# Patient Record
Sex: Male | Born: 1990 | Race: White | Hispanic: No | Marital: Single
Health system: Southern US, Community
[De-identification: ages and names within clinical notes are randomized; demographics above are authoritative.]

## PROBLEM LIST (undated history)

## (undated) DIAGNOSIS — J039 Acute tonsillitis, unspecified: Secondary | ICD-10-CM

## (undated) MED ORDER — HYDROCODONE-ACETAMINOPHEN 5 MG-325 MG TAB
5-325 mg | ORAL_TABLET | ORAL | Status: DC | PRN
Start: ? — End: 2012-05-06

## (undated) MED ORDER — PROMETHAZINE 25 MG TAB
25 mg | ORAL_TABLET | Freq: Four times a day (QID) | ORAL | Status: DC | PRN
Start: ? — End: 2012-07-05

## (undated) MED ORDER — AZITHROMYCIN 250 MG TAB
250 mg | PACK | ORAL | Status: DC
Start: ? — End: 2017-01-27

## (undated) MED ORDER — NAPROXEN 500 MG TAB
500 mg | ORAL_TABLET | Freq: Two times a day (BID) | ORAL | Status: AC
Start: ? — End: 2011-11-16

---
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
## 2010-09-01 NOTE — ED Notes (Signed)
Patient woke up with sore throat today and has developed nasal congestion throughout the day today.  Dizzy spells on and off today -- just feeling like head is full.

## 2010-09-01 NOTE — ED Provider Notes (Signed)
I have personally seen and evaluated patient. I find the patient's history and physical exam are consistent with the PA's NP documentation. I agree with the care provided, treatments rendered, disposition and follow up plan.

## 2010-09-01 NOTE — ED Provider Notes (Signed)
HPI Comments: Patient reports he woke up with a sore throat and has developed nasal congestion, dizziness and head congestion through out the day. Patient denies fever or chills, denies recent illness.    Patient is a 20 y.o. male presenting with sore throat, nasal congestion, and dizziness. The history is provided by the patient.   Sore Throat   This is a new problem. The current episode started 12 to 24 hours ago. The problem has not changed since onset.There has been no fever. Associated symptoms include headaches. Pertinent negatives include no diarrhea, no vomiting, no congestion, no drooling, no ear discharge, no ear pain, no plugged ear sensation, no shortness of breath, no stridor, no swollen glands, no trouble swallowing, no stiff neck and no cough. He has had no exposure to strep or mono. He has tried nothing for the symptoms.   Nasal Congestion  This is a new problem. The current episode started 12 to 24 hours ago. The problem occurs constantly. The problem has not changed since onset.Associated symptoms include headaches. Pertinent negatives include no chest pain, no abdominal pain and no shortness of breath. The symptoms are aggravated by nothing. The symptoms are relieved by nothing. He has tried nothing for the symptoms.   Dizziness   This is a new problem. The current episode started 6 to 12 hours ago. The problem has not changed since onset.There was no focality noted. Pertinent negatives include no focal weakness, no loss of sensation, no loss of balance, no slurred speech, no speech difficulty, no memory loss, no movement disorder, no agitation, no visual change, no auditory change, no mental status change, no unresponsiveness and no disorientation. There has been no fever. Associated symptoms include headaches. Pertinent negatives include no shortness of breath, no chest pain, no vomiting, no altered mental status, no confusion, no choking, no nausea, no bowel incontinence and no bladder incontinence. There were no medications administered prior to arrival.        No past medical history on file.     No past surgical history on file.      No family history on file.     History     Social History   ??? Marital Status: Single     Spouse Name: N/A     Number of Children: N/A   ??? Years of Education: N/A     Occupational History   ??? Not on file.     Social History Main Topics   ??? Smoking status: Not on file   ??? Smokeless tobacco: Not on file   ??? Alcohol Use: Not on file   ??? Drug Use: Not on file   ??? Sexually Active: Not on file     Other Topics Concern   ??? Not on file     Social History Narrative   ??? No narrative on file                  ALLERGIES: Review of patient's allergies indicates no known allergies.      Review of Systems   Constitutional: Negative.  Negative for fever, chills and fatigue.   HENT: Positive for sore throat. Negative for ear pain, congestion, drooling, trouble swallowing, neck pain and ear discharge.    Eyes: Negative.    Respiratory: Negative.  Negative for cough, choking, shortness of breath and stridor.    Cardiovascular: Negative.  Negative for chest pain.   Gastrointestinal: Negative.  Negative for nausea, vomiting, abdominal pain, diarrhea and bowel incontinence.  Genitourinary: Negative.  Negative for bladder incontinence.   Musculoskeletal: Negative.    Skin: Negative.    Neurological: Positive for dizziness and headaches. Negative for focal weakness, speech difficulty and loss of balance.   Hematological: Negative.    Psychiatric/Behavioral: Negative.  Negative for memory loss, confusion, agitation and altered mental status.   All other systems reviewed and are negative.        Filed Vitals:    09/01/10 2212   BP: 151/72   Pulse: 112   Temp: 99 ??F (37.2 ??C)   Resp: 20   Height: 6\' 3"  (1.905 m)   Weight: 136.533 kg (301 lb)   SpO2: 99%            Physical Exam   Nursing note and vitals reviewed.  Constitutional: He is oriented to person, place, and time. He appears well-developed and well-nourished.   HENT:   Head: Normocephalic and atraumatic.   Right Ear: Hearing, tympanic membrane, external ear and ear canal normal.   Left Ear: Hearing, tympanic membrane, external ear and ear canal normal.   Nose: Rhinorrhea present. Right sinus exhibits no maxillary sinus tenderness and no frontal sinus tenderness. Left sinus exhibits no maxillary sinus tenderness and no frontal sinus tenderness.   Mouth/Throat: Posterior oropharyngeal edema and posterior oropharyngeal erythema present. No oropharyngeal exudate or tonsillar abscesses.   Eyes: Conjunctivae and EOM are normal. Pupils are equal, round, and reactive to light. Right eye exhibits no discharge. Left eye exhibits no discharge. No scleral icterus.   Neck: Normal range of motion. Neck supple. No tracheal deviation present. No thyromegaly present.   Cardiovascular: Normal rate, regular rhythm, normal heart sounds and intact distal pulses.    No murmur heard.  Pulmonary/Chest: Effort normal and breath sounds normal. No respiratory distress. He has no wheezes. He has no rales.   Abdominal: Soft. He exhibits no distension. There is no tenderness. There is no rebound and no guarding.    Musculoskeletal: Normal range of motion. He exhibits no edema and no tenderness.   Lymphadenopathy:     He has no cervical adenopathy.   Neurological: He is alert and oriented to person, place, and time. No cranial nerve deficit. Coordination normal.   Skin: Skin is warm. No rash noted. No erythema.   Psychiatric: He has a normal mood and affect. His behavior is normal. Judgment and thought content normal.        MDM    Procedures

## 2010-09-02 LAB — POC GROUP A STREP: Group A strep (POC): NEGATIVE

## 2010-09-02 MED ORDER — PSEUDOEPHEDRINE SR 120 MG TAB
120 mg | ORAL_TABLET | Freq: Two times a day (BID) | ORAL | Status: DC | PRN
Start: 2010-09-02 — End: 2011-11-06

## 2011-07-05 NOTE — ED Notes (Signed)
I have reviewed discharge instructions with the patient.  The patient verbalized understanding.  Home with family.

## 2011-07-05 NOTE — ED Provider Notes (Signed)
Patient is a 21 y.o. male presenting with dental problem. The history is provided by the patient.   Dental Pain   This is a new problem. The current episode started more than 2 days ago. The problem occurs constantly. The problem has not changed since onset.The pain is located in the right upper mouth.The quality of the pain is aching.  The pain is at a severity of 9/10. The pain is severe. There was no vomiting, no nausea, no fever, no swelling, no chest pain, no shortness of breath, no headaches, no gum redness and no drainage. He has tried acetaminophen for the symptoms. The patient has no cardiac history.       History reviewed. No pertinent past medical history.     History reviewed. No pertinent past surgical history.      History reviewed. No pertinent family history.     History     Social History   ??? Marital Status: SINGLE     Spouse Name: N/A     Number of Children: N/A   ??? Years of Education: N/A     Occupational History   ??? Not on file.     Social History Main Topics   ??? Smoking status: Current Everyday Smoker   ??? Smokeless tobacco: Not on file   ??? Alcohol Use: No   ??? Drug Use:    ??? Sexually Active:      Other Topics Concern   ??? Not on file     Social History Narrative   ??? No narrative on file                  ALLERGIES: Review of patient's allergies indicates no known allergies.      Review of Systems   Constitutional: Negative.    HENT: Positive for dental problem.    Eyes: Negative.    Respiratory: Negative.    Cardiovascular: Negative.    Gastrointestinal: Negative.    Genitourinary: Negative.    Musculoskeletal: Negative.    Skin: Negative.    Neurological: Negative.    Hematological: Negative.    Psychiatric/Behavioral: Negative.    All other systems reviewed and are negative.        Filed Vitals:    07/05/11 2018   BP: 157/82   Pulse: 95   Temp: 98.3 ??F (36.8 ??C)   Resp: 18   Height: 6\' 3"  (1.905 m)   Weight: 95.255 kg (210 lb)   SpO2: 99%            Physical Exam   Nursing note and vitals  reviewed.  Constitutional: He is oriented to person, place, and time. He appears well-developed and well-nourished.   HENT:   Head: Normocephalic and atraumatic. No trismus in the jaw.   Right Ear: External ear normal.   Left Ear: External ear normal.   Mouth/Throat: Oropharynx is clear and moist and mucous membranes are normal. He does not have dentures. No oral lesions. Normal dentition. Dental caries present. No dental abscesses, uvula swelling or lacerations. No oropharyngeal exudate.   Eyes: Conjunctivae and EOM are normal. Pupils are equal, round, and reactive to light. Right eye exhibits no discharge. Left eye exhibits no discharge. No scleral icterus.   Neck: Normal range of motion. Neck supple. No tracheal deviation present. No thyromegaly present.   Cardiovascular: Normal rate, regular rhythm, normal heart sounds and intact distal pulses.    No murmur heard.  Pulmonary/Chest: Effort normal and breath sounds normal. No respiratory  distress. He has no wheezes. He has no rales.   Abdominal: Soft. Bowel sounds are normal. He exhibits no distension. There is no tenderness. There is no rebound and no guarding.   Musculoskeletal: Normal range of motion. He exhibits no edema and no tenderness.   Lymphadenopathy:     He has no cervical adenopathy.   Neurological: He is alert and oriented to person, place, and time. No cranial nerve deficit. Coordination normal.   Skin: Skin is warm. No rash noted. No erythema.   Psychiatric: He has a normal mood and affect. His behavior is normal. Judgment and thought content normal.        MDM    Procedures

## 2011-07-05 NOTE — ED Provider Notes (Signed)
I have personally seen and evaluated patient. I find the patient's history and physical exam are consistent with the PA's NP documentation. I agree with the care provided, treatments rendered, disposition and follow up plan.

## 2011-07-05 NOTE — ED Notes (Signed)
This RN never saw or assessed this patient

## 2011-07-05 NOTE — ED Notes (Signed)
Patient reports dental pain for 1 week.  Has taken ibuprofen and used oragel without relief.

## 2011-07-06 MED ORDER — ACETAMINOPHEN-CODEINE 300 MG-30 MG TAB
300-30 mg | ORAL_TABLET | Freq: Four times a day (QID) | ORAL | Status: DC | PRN
Start: 2011-07-06 — End: 2011-11-06

## 2011-07-06 MED ORDER — NAPROXEN 375 MG TAB
375 mg | ORAL_TABLET | Freq: Two times a day (BID) | ORAL | Status: DC
Start: 2011-07-06 — End: 2011-11-06

## 2011-07-06 MED ORDER — PENICILLIN V-K 500 MG TAB
500 mg | ORAL_TABLET | Freq: Three times a day (TID) | ORAL | Status: AC
Start: 2011-07-06 — End: 2011-07-12

## 2011-11-06 NOTE — ED Provider Notes (Signed)
HPI Comments: Pt c/o right hand pain secondary to shutting a car door on it earlier today. Pt reports he is still able to move his hands and finger well but they are bruised and painful. Pt denies any numbness/tingling in his hand. Pt denies any other injury. Pt denies CP, SOB, abdominal pain, n/v/d, dysuria, hematuria or frequency with urination.    Patient is a 21 y.o. male presenting with hand pain. The history is provided by the patient.   Hand Pain   This is a new problem. The current episode started 1 to 2 hours ago. The problem occurs constantly. The problem has not changed since onset.The pain is present in the right fingers. The pain is at a severity of 8/10. The pain is moderate. Pertinent negatives include no numbness, full range of motion, no stiffness, no tingling, no itching, no back pain and no neck pain. He has tried nothing for the symptoms. There has been a history of trauma.        History reviewed. No pertinent past medical history.     History reviewed. No pertinent past surgical history.      History reviewed. No pertinent family history.     History     Social History   ??? Marital Status: SINGLE     Spouse Name: N/A     Number of Children: N/A   ??? Years of Education: N/A     Occupational History   ??? Not on file.     Social History Main Topics   ??? Smoking status: Current Every Day Smoker -- 0.50 packs/day   ??? Smokeless tobacco: Never Used   ??? Alcohol Use: No   ??? Drug Use: No   ??? Sexually Active: Not on file     Other Topics Concern   ??? Not on file     Social History Narrative   ??? No narrative on file                  ALLERGIES: Review of patient's allergies indicates no known allergies.      Review of Systems   Constitutional: Negative for fever and chills.   HENT: Negative for ear pain, sore throat and neck pain.    Eyes: Negative for pain.   Respiratory: Negative for cough and wheezing.    Cardiovascular: Negative for chest pain.   Gastrointestinal: Negative for nausea, vomiting and abdominal  pain.   Genitourinary: Negative for dysuria and frequency.   Musculoskeletal: Positive for joint swelling and arthralgias. Negative for back pain and stiffness.   Skin: Negative for color change, itching and wound.   Neurological: Negative for dizziness, tingling and numbness.   Hematological: Negative for adenopathy.   Psychiatric/Behavioral: Negative for confusion and agitation.       Filed Vitals:    11/06/11 2227   BP: 132/83   Pulse: 109   Temp: 98.4 ??F (36.9 ??C)   Resp: 20   Height: 6\' 3"  (1.905 m)   Weight: 97.523 kg (215 lb)   SpO2: 98%            Physical Exam   Nursing note and vitals reviewed.  Constitutional: He is oriented to person, place, and time. He appears well-developed and well-nourished.   HENT:   Head: Normocephalic and atraumatic.   Right Ear: External ear normal.   Left Ear: External ear normal.   Eyes: Conjunctivae and EOM are normal. Pupils are equal, round, and reactive to light. Right eye exhibits no  discharge. Left eye exhibits no discharge. No scleral icterus.   Neck: Normal range of motion.   Cardiovascular: Normal rate, regular rhythm, normal heart sounds and intact distal pulses.  Exam reveals no gallop and no friction rub.    No murmur heard.  Pulses:       Radial pulses are 2+ on the right side, and 2+ on the left side.   Pulmonary/Chest: Effort normal and breath sounds normal. No respiratory distress. He has no wheezes. He has no rales.   Musculoskeletal: He exhibits tenderness. He exhibits no edema.        Right wrist: Normal.        Left wrist: Normal.        Right hand: He exhibits tenderness, bony tenderness and swelling. He exhibits normal range of motion, normal two-point discrimination, normal capillary refill, no deformity and no laceration.        Left hand: Normal.        Hands:  Lymphadenopathy:     He has no cervical adenopathy.   Neurological: He is alert and oriented to person, place, and time. No cranial nerve deficit. Coordination normal.   Skin: Skin is warm. No  rash noted. No erythema.   Psychiatric: He has a normal mood and affect. His behavior is normal. Judgment and thought content normal.        MDM    Procedures

## 2011-11-06 NOTE — ED Notes (Signed)
Pt reports shutting his right hand in a car door today.  Used Ice immediately afterwards.  Pain from middle finger down to 5th finger.

## 2011-11-07 MED ADMIN — ketorolac (TORADOL) injection 60 mg: INTRAMUSCULAR | @ 03:00:00 | NDC 00409379501

## 2011-11-07 MED FILL — KETOROLAC TROMETHAMINE 30 MG/ML INJECTION: 30 mg/mL (1 mL) | INTRAMUSCULAR | Qty: 2

## 2011-11-07 NOTE — ED Notes (Signed)
I have reviewed discharge instructions with the patient.  The patient verbalized understanding. Patient ambulatory and stable upon discharge in no apparent distress.

## 2011-11-07 NOTE — ED Provider Notes (Signed)
I was personally available for consultation in the emergency department.  I have reviewed the chart and agree with the documentation recorded by the MLP, including the assessment, treatment plan, and disposition.  Gracelyn Coventry H Jaise Moser, MD

## 2012-01-13 NOTE — ED Provider Notes (Signed)
HPI Comments: The patient hyperextended his left little finger while "horseplaying" earlier today. He describes what sounds like a dislocation at the MP joint. His friend put the finger back in place. The patient now complains of pain primarily in the area of the MP joint area there is no other injury.    Patient is a 21 y.o. male presenting with finger pain.   Finger Pain          History reviewed. No pertinent past medical history.     History reviewed. No pertinent past surgical history.      History reviewed. No pertinent family history.     History     Social History   ??? Marital Status: MARRIED     Spouse Name: N/A     Number of Children: N/A   ??? Years of Education: N/A     Occupational History   ??? Not on file.     Social History Main Topics   ??? Smoking status: Current Every Day Smoker -- 0.50 packs/day   ??? Smokeless tobacco: Never Used   ??? Alcohol Use: No   ??? Drug Use: No   ??? Sexually Active: Not on file     Other Topics Concern   ??? Not on file     Social History Narrative   ??? No narrative on file                  ALLERGIES: Review of patient's allergies indicates no known allergies.      Review of Systems   Musculoskeletal: Positive for joint swelling.       Filed Vitals:    01/13/12 1640   BP: 133/79   Pulse: 94   Temp: 98 ??F (36.7 ??C)   Resp: 17   Height: 6\' 3"  (1.905 m)   Weight: 111.16 kg (245 lb 1 oz)   SpO2: 99%            Physical Exam   Nursing note and vitals reviewed.  Constitutional: He is oriented to person, place, and time. He appears well-developed and well-nourished. No distress.   Neck: Normal range of motion.   Cardiovascular: Normal rate.    Pulmonary/Chest: Effort normal. No respiratory distress.   Musculoskeletal:   There is mild swelling/tenderness of the left little finger MP joint. The PIP and DIP joints are normal with normal range of motion. The remainder of the hand is nontender/nonswollen. The little finger has normal distal sensation and capillary refill.   Neurological: He is  alert and oriented to person, place, and time.   Skin: Skin is warm and dry.   Psychiatric: He has a normal mood and affect. His behavior is normal.        MDM    Procedures

## 2012-01-13 NOTE — ED Notes (Signed)
Patient discharged home after receiving discharge instructions from MD/PA.  Patient voiced understanding and doesn't have any questions at this time. Patient in no distress at this time.

## 2012-01-13 NOTE — ED Notes (Signed)
Patient states he broke his left pinky finger today "rough housing" with friends.

## 2012-05-06 LAB — CBC WITH AUTOMATED DIFF
ABS. BASOPHILS: 0 10*3/uL (ref 0.0–0.1)
ABS. EOSINOPHILS: 0 10*3/uL (ref 0.0–0.4)
ABS. LYMPHOCYTES: 0.7 10*3/uL
ABS. MONOCYTES: 0.5 10*3/uL (ref 0.0–1.0)
ABS. NEUTROPHILS: 6.1 10*3/uL (ref 1.8–8.0)
BASOPHILS: 0 % (ref 0–1)
EOSINOPHILS: 0 % (ref 0–7)
HCT: 45.8 % (ref 36.6–50.3)
HGB: 16 g/dL (ref 12.1–17.0)
LYMPHOCYTES: 10 % — ABNORMAL LOW (ref 12–49)
MCH: 29.7 PG (ref 26.0–34.0)
MCHC: 34.9 g/dL (ref 30.0–36.5)
MCV: 85.1 FL (ref 80.0–99.0)
MONOCYTES: 7 % (ref 5–13)
NEUTROPHILS: 83 % — ABNORMAL HIGH (ref 32–75)
PLATELET: 275 10*3/uL (ref 150–400)
RBC: 5.38 M/uL (ref 4.10–5.70)
RDW: 13.2 % (ref 11.5–14.5)
WBC: 7.4 10*3/uL (ref 4.1–11.1)

## 2012-05-06 LAB — AMYLASE: Amylase: 27 U/L (ref 25–115)

## 2012-05-06 MED ADMIN — sodium chloride 0.9 % bolus infusion 2,000 mL: INTRAVENOUS | @ 13:00:00 | NDC 00409798309

## 2012-05-06 MED ADMIN — ondansetron (ZOFRAN) injection 4 mg: INTRAVENOUS | @ 13:00:00 | NDC 00409475503

## 2012-05-06 NOTE — ED Notes (Signed)
Pt given discharge instructions by Dr Harrie Jeans, pt verbalizes an understanding, pt stable at time of discharge ambulates to lobby

## 2012-05-06 NOTE — ED Provider Notes (Signed)
HPI Comments: 22 year old white male presents complaining of persistent nausea and vomiting since yesterday. He states that in the vomiting spells he appears to gag to become short of breath. At baseline he denies shortness of breath or cough.  He denies any abdominal pain. He's had no diarrhea. He denies any obvious contacts. He does smoke tobacco but denies excessive alcohol use or illicit drug use. He also states he feels somewhat dizzy. He has not been on any recent antibiotics. He denies prior surgery.    Patient is a 22 y.o. male presenting with vomiting and respiratory complaint.   Vomiting   Associated symptoms include chills and a fever. Pertinent negatives include no abdominal pain.   Breathing Problem  Associated symptoms include a fever and vomiting. Pertinent negatives include no rhinorrhea, no neck pain and no abdominal pain.        History reviewed. No pertinent past medical history.     History reviewed. No pertinent past surgical history.      History reviewed. No pertinent family history.     History     Social History   ??? Marital Status: MARRIED     Spouse Name: N/A     Number of Children: N/A   ??? Years of Education: N/A     Occupational History   ??? Not on file.     Social History Main Topics   ??? Smoking status: Current Every Day Smoker -- 0.50 packs/day   ??? Smokeless tobacco: Never Used   ??? Alcohol Use: No   ??? Drug Use: No   ??? Sexually Active: Not on file     Other Topics Concern   ??? Not on file     Social History Narrative   ??? No narrative on file                  ALLERGIES: Review of patient's allergies indicates no known allergies.      Review of Systems   Constitutional: Positive for fever and chills.   HENT: Negative for congestion, rhinorrhea, neck pain and neck stiffness.    Respiratory: Positive for shortness of breath.    Gastrointestinal: Positive for nausea and vomiting. Negative for abdominal pain.   Genitourinary: Negative for flank pain.   Musculoskeletal: Negative for back pain.    Neurological: Positive for dizziness.   All other systems reviewed and are negative.        Filed Vitals:    05/06/12 0826   BP: 135/81   Pulse: 116   Temp: 100 ??F (37.8 ??C)   Resp: 18   Height: 6' 0.76" (1.848 m)   Weight: 128.05 kg (282 lb 4.8 oz)   SpO2: 100%            Physical Exam   Nursing note and vitals reviewed.  Constitutional: He is oriented to person, place, and time. He appears well-developed and well-nourished. No distress.   HENT:   Head: Normocephalic and atraumatic.   Eyes: Conjunctivae and EOM are normal. Pupils are equal, round, and reactive to light.   Neck: Neck supple.   Cardiovascular: Regular rhythm.  Tachycardia present.  Exam reveals no gallop and no friction rub.    No murmur heard.  Pulmonary/Chest: Effort normal and breath sounds normal. No respiratory distress. He has no wheezes. He has no rales. He exhibits no tenderness.   Abdominal: Soft. Bowel sounds are normal. He exhibits no distension and no mass. There is no tenderness. There is no rebound and  no guarding.   Musculoskeletal: Normal range of motion. He exhibits no edema and no tenderness.   Neurological: He is alert and oriented to person, place, and time.   Skin: Skin is warm and dry.        MDM     Differential Diagnosis; Clinical Impression; Plan:     Differential diagnosis-viral gastroenteritis, bacterial gastroenteritis, gastritis, early appendicitis, influenza, biliary disease. Clinical impression/plan-patient presents complaining of multiple episodes of nausea and vomiting, dizziness and vague shortness of breath while vomiting. In ER he had mild signs of dehydration. His belly was soft. X-ray was normal. Blood works otherwise normal as well. He responded well to fluids. Serial exam of his abdomen were benign. He is tolerating p.o. And discharged. He is encouraged to return for uncontrolled vomiting pain fever or chills.  Amount and/or Complexity of Data Reviewed:   Clinical lab tests:  Reviewed and ordered  Tests in the  radiology section of CPT??:  Ordered and reviewed  Tests in the medicine section of the CPT??:  Ordered and reviewed  Discussion of test results with the performing providers:  No   Decide to obtain previous medical records or to obtain history from someone other than the patient:  Yes   Obtain history from someone other than the patient:  No   Review and summarize past medical records:  Yes   Discuss the patient with another provider:  No   Independant visualization of image, tracing, or specimen:  Yes  Risk of Significant Complications, Morbidity, and/or Mortality:   Presenting problems:  Moderate  Diagnostic procedures:  Low  Management options:  Moderate  Progress:   Patient progress:  Stable and improved      Procedures    9:49 AM-progress note-patient's tolerating p.o. Vital signs have improved. His heart rates in the 90s. He feels stable for discharge. His abdomen is soft.

## 2012-05-06 NOTE — ED Notes (Signed)
Pt presents to ED with c/o having vomiting x 11-12 times since 1700 yesterday afternoon. Pt states he has had periodic shortness of breath and dizziness.

## 2012-07-05 LAB — CBC WITH AUTOMATED DIFF
ABS. BASOPHILS: 0 10*3/uL (ref 0.0–0.1)
ABS. EOSINOPHILS: 0 10*3/uL (ref 0.0–0.4)
ABS. LYMPHOCYTES: 2.2 10*3/uL (ref 0.8–3.5)
ABS. MONOCYTES: 1.2 10*3/uL — ABNORMAL HIGH (ref 0.0–1.0)
ABS. NEUTROPHILS: 16.6 10*3/uL — ABNORMAL HIGH (ref 1.8–8.0)
BASOPHILS: 0 % (ref 0–1)
EOSINOPHILS: 0 % (ref 0–7)
HCT: 43.4 % (ref 36.6–50.3)
HGB: 15.3 g/dL (ref 12.1–17.0)
LYMPHOCYTES: 11 % — ABNORMAL LOW (ref 12–49)
MCH: 29.9 PG (ref 26.0–34.0)
MCHC: 35.3 g/dL (ref 30.0–36.5)
MCV: 84.9 FL (ref 80.0–99.0)
MONOCYTES: 6 % (ref 5–13)
NEUTROPHILS: 83 % — ABNORMAL HIGH (ref 32–75)
PLATELET: 269 10*3/uL (ref 150–400)
RBC: 5.11 M/uL (ref 4.10–5.70)
RDW: 13.4 % (ref 11.5–14.5)
WBC: 20 10*3/uL — ABNORMAL HIGH (ref 4.1–11.1)

## 2012-07-05 LAB — POC CHEM8
Anion gap (POC): 20 mmol/L — ABNORMAL HIGH (ref 5–15)
BUN (POC): 13 MG/DL (ref 9–20)
CO2 (POC): 21 MMOL/L (ref 21–32)
Calcium, ionized (POC): 1.17 MMOL/L (ref 1.12–1.32)
Chloride (POC): 103 MMOL/L (ref 98–107)
Creatinine (POC): 0.8 MG/DL (ref 0.6–1.3)
GFRAA, POC: >60 mL/min/{1.73_m2} (ref >60)
GFRNA, POC: >60 mL/min/{1.73_m2} (ref >60)
Glucose (POC): 118 MG/DL — ABNORMAL HIGH (ref 75–110)
Hematocrit (POC): 45 % (ref 36.6–50.3)
Hemoglobin (POC): 15.3 GM/DL (ref 12.1–17.0)
Potassium (POC): 3.5 MMOL/L (ref 3.5–5.1)
Sodium (POC): 140 MMOL/L (ref 136–145)

## 2012-07-05 MED FILL — SODIUM CHLORIDE 0.9 % IV: INTRAVENOUS | Qty: 1000

## 2012-07-05 NOTE — ED Notes (Signed)
CT is negative. Will discharge home.

## 2012-07-05 NOTE — ED Provider Notes (Signed)
Patient is a 22 y.o. male presenting with sore throat, syncope, and chest pain. The history is provided by the patient.   Sore Throat   This is a new (woke up with symptoms this AM) problem. The problem has been gradually worsening. Patient reports a subjective fever - was not measured.Associated symptoms include vomiting, congestion and cough. Pertinent negatives include no diarrhea, no ear discharge, no ear pain, no headaches, no plugged ear sensation, no shortness of breath, no stridor, no swollen glands, no trouble swallowing and no stiff neck. He has tried nothing for the symptoms. The treatment provided no relief.   Syncope   This is a new (went to work feeling sick Positive coughing jag and then emesis and then passed out in toilet.  No loss of B/B) problem. Episode onset: this AM. The problem has been rapidly improving. Length of episode of loss of consciousness: "maybe 5 seconds. Associated with: being sick and going to work outside very hot out. Associated symptoms include chest pain, malaise/fatigue, vomiting, congestion and light-headedness. Pertinent negatives include no palpitations, no abdominal pain, no bowel incontinence, no bladder incontinence, no headaches, no back pain, no focal weakness, no seizures, no melena, no anal bleeding and no head injury. Associated symptoms comments: Did not hit head or shoulder.  Positive numbness left ar waxing and waning. He has tried nothing for the symptoms. The treatment provided no relief. His past medical history does not include no CVA, no TIA, no CAD, no DM, no HTN, no vertigo, no seizures or no syncope.   Chest Pain (Angina)   Associated symptoms include cough, malaise/fatigue, numbness, syncope and vomiting. Pertinent negatives include no abdominal pain, no back pain, no headaches, no palpitations and no shortness of breath. His past medical history does not include DM or HTN.        No past medical history on file.     No past surgical history on file.       No family history on file.     History     Social History   ??? Marital Status: MARRIED     Spouse Name: N/A     Number of Children: N/A   ??? Years of Education: N/A     Occupational History   ??? Not on file.     Social History Main Topics   ??? Smoking status: Current Every Day Smoker -- 0.50 packs/day   ??? Smokeless tobacco: Never Used   ??? Alcohol Use: No   ??? Drug Use: No   ??? Sexually Active: Not on file     Other Topics Concern   ??? Not on file     Social History Narrative   ??? No narrative on file                  ALLERGIES: Review of patient's allergies indicates no known allergies.      Review of Systems   Constitutional: Positive for chills, malaise/fatigue and fatigue. Negative for appetite change and unexpected weight change.   HENT: Positive for congestion and sore throat. Negative for ear pain, facial swelling, rhinorrhea, trouble swallowing, neck pain, dental problem and ear discharge.    Eyes: Negative for pain and discharge.   Respiratory: Positive for cough. Negative for apnea, shortness of breath and stridor.    Cardiovascular: Positive for chest pain and syncope. Negative for palpitations and leg swelling.   Gastrointestinal: Positive for vomiting. Negative for abdominal pain, diarrhea, blood in stool, melena, abdominal distention, anal  bleeding and bowel incontinence.   Genitourinary: Negative for bladder incontinence, dysuria, hematuria, flank pain and difficulty urinating.   Musculoskeletal: Negative for myalgias, back pain, joint swelling and arthralgias.   Skin: Negative for color change, rash and wound.   Neurological: Positive for light-headedness and numbness. Negative for focal weakness, seizures, syncope, facial asymmetry, speech difficulty and headaches.   Hematological: Negative for adenopathy.   Psychiatric/Behavioral: Negative for suicidal ideas, hallucinations, behavioral problems, self-injury and agitation.       Filed Vitals:    07/05/12 1844   BP: 147/80   Pulse: 125   Temp: 100.5 ??F  (38.1 ??C)   Resp: 18   Height: 6\' 3"  (1.905 m)   Weight: 128.368 kg (283 lb)   SpO2: 98%            Physical Exam   Nursing note and vitals reviewed.  Constitutional: He is oriented to person, place, and time. He appears well-developed and well-nourished. He appears distressed.   HENT:   Head: Normocephalic and atraumatic.   Right Ear: External ear normal.   Left Ear: External ear normal.   Mouth/Throat: No oropharyngeal exudate.   Moderate erythema with tonsils cryptic +3/4 and mild exudates   Eyes: Conjunctivae and EOM are normal. Pupils are equal, round, and reactive to light. Right eye exhibits no discharge. Left eye exhibits no discharge. No scleral icterus.   Neck: Normal range of motion. Neck supple. No JVD present. No tracheal deviation present. No thyromegaly present.   Cardiovascular: Regular rhythm, normal heart sounds and intact distal pulses.    No murmur heard.  Increased rate   Pulmonary/Chest: Effort normal and breath sounds normal. No respiratory distress. He has no wheezes. He has no rales.   Abdominal: Soft. Bowel sounds are normal. He exhibits no distension. There is no tenderness. There is no rebound and no guarding.   Musculoskeletal: Normal range of motion. He exhibits no edema and no tenderness.   Subjective diminished sensation left arm entire length glove distribution.   Lymphadenopathy:     He has no cervical adenopathy.   Neurological: He is alert and oriented to person, place, and time. No cranial nerve deficit. Coordination normal.   NIH =1 based on numbness. oTherwise negative   Skin: Skin is warm. No rash noted. No erythema.   Psychiatric: He has a normal mood and affect. His behavior is normal. Judgment and thought content normal.        MDM     Amount and/or Complexity of Data Reviewed:   Clinical lab tests:  Ordered and reviewed  Tests in the radiology section of CPT??:  Ordered and reviewed  Tests in the medicine section of the CPT??:  Ordered and reviewed   Independant  visualization of image, tracing, or specimen:  Yes  Risk of Significant Complications, Morbidity, and/or Mortality:   Presenting problems:  Moderate  Diagnostic procedures:  Moderate  Management options:  Moderate  General Comments: Labs reviewed.  Will finish IV fluids and AB's.  Neuro stable and appears improved.  Pending CT, if negative plan DC on AB's, rest and fluids.    Syncope most likely vasavagal. Left arm symptoms possibly positional after syncope?  CVA highly unlikely. Vs cervical disc issue-no pysical pain  About c spine.  Progress:   Patient progress:  Improved      Procedures    ED EKG interpretation:  Rhythm: sinus tachycardia; and regular . Rate (approx.): 113; Axis: normal; P wave: normal; QRS interval: normal ;  ST/T wave: normal; in  Lead: ; Other findings: borderline ekg. This EKG was interpreted by Sunnie Nielsen. Mort Sawyers, DO,ED Provider.

## 2012-07-05 NOTE — ED Notes (Signed)
Woke up with sore throat and then had a syncopal episode at work.  Now having chest pain and left arm numbness.

## 2012-07-05 NOTE — ED Notes (Signed)
Patient discharged home with girlfriend. Patient given discharge instructions and understanding voiced by the patient.

## 2012-07-06 LAB — EKG, 12 LEAD, INITIAL
Atrial Rate: 113 {beats}/min
Calculated P Axis: 39 degrees
Calculated R Axis: 37 degrees
Calculated T Axis: 40 degrees
P-R Interval: 166 ms
Q-T Interval: 316 ms
QRS Duration: 82 ms
QTC Calculation (Bezet): 433 ms
Ventricular Rate: 113 {beats}/min

## 2012-07-06 MED ADMIN — sodium chloride 0.9 % bolus infusion 1,000 mL: INTRAVENOUS | NDC 00409798309

## 2012-07-06 MED ADMIN — cefTRIAXone (ROCEPHIN) 1 g in 0.9% sodium chloride (MBP/ADV) 50 mL MBP: INTRAVENOUS | NDC 00781320885

## 2012-07-06 MED ADMIN — acetaminophen (TYLENOL) tablet 1,000 mg: ORAL | NDC 00536321801

## 2012-07-06 MED FILL — ACETAMINOPHEN 500 MG TAB: 500 mg | ORAL | Qty: 2

## 2012-07-06 MED FILL — CEFTRIAXONE 1 GRAM SOLUTION FOR INJECTION: 1 gram | INTRAMUSCULAR | Qty: 1

## 2017-01-27 ENCOUNTER — Emergency Department
Admit: 2017-01-27 | Payer: BLUE CROSS/BLUE SHIELD | Primary: Student in an Organized Health Care Education/Training Program

## 2017-01-27 ENCOUNTER — Inpatient Hospital Stay
Admit: 2017-01-27 | Discharge: 2017-01-27 | Disposition: A | Discharge status: Home / Self Care | Payer: BLUE CROSS/BLUE SHIELD | Attending: Emergency Medicine

## 2017-01-27 DIAGNOSIS — J209 Acute bronchitis, unspecified: Secondary | ICD-10-CM

## 2017-01-27 MED ORDER — AZITHROMYCIN 250 MG TAB
250 mg | ORAL_TABLET | ORAL | 0 refills | Status: DC
Start: 2017-01-27 — End: 2017-03-11

## 2017-01-27 MED ORDER — BENZONATATE 100 MG CAP
100 mg | Freq: Once | ORAL | Status: AC
Start: 2017-01-27 — End: 2017-01-27
  Administered 2017-01-27: 16:00:00 via ORAL

## 2017-01-27 MED ORDER — PREDNISONE 10 MG TABLETS IN A DOSE PACK
10 mg | ORAL_TABLET | ORAL | 0 refills | Status: DC
Start: 2017-01-27 — End: 2017-03-11

## 2017-01-27 MED ORDER — BENZONATATE 100 MG CAP
100 mg | ORAL_CAPSULE | Freq: Three times a day (TID) | ORAL | 0 refills | Status: DC | PRN
Start: 2017-01-27 — End: 2017-03-11

## 2017-01-27 MED FILL — BENZONATATE 100 MG CAP: 100 mg | ORAL | Qty: 1

## 2017-01-27 NOTE — ED Provider Notes (Signed)
HPI   Pt is a 27 y.o. M with significant PMH of tobacco abuse presenting to ED with c/o cough, chest and nasal congestion, runny nose x 1 month.  He has tried mucinex without relief of sx.  His cough is dry and not productive.  He has post tussive emesis.  Today at work he became more short of breath causing him to come to ED.  He has 2 sick contacts at home with similar URI and respiratory sx.  No other complaints at this time.     Past Medical History:   Diagnosis Date   ??? Migraines        Past Surgical History:   Procedure Laterality Date   ??? HX ORTHOPAEDIC      Left hand         History reviewed. No pertinent family history.    Social History     Socioeconomic History   ??? Marital status: SINGLE     Spouse name: Not on file   ??? Number of children: Not on file   ??? Years of education: Not on file   ??? Highest education level: Not on file   Social Needs   ??? Financial resource strain: Not on file   ??? Food insecurity - worry: Not on file   ??? Food insecurity - inability: Not on file   ??? Transportation needs - medical: Not on file   ??? Transportation needs - non-medical: Not on file   Occupational History   ??? Not on file   Tobacco Use   ??? Smoking status: Current Every Day Smoker     Packs/day: 1.00   ??? Tobacco comment: Pt also vapes.   Substance and Sexual Activity   ??? Alcohol use: No   ??? Drug use: No   ??? Sexual activity: Not on file   Other Topics Concern   ??? Not on file   Social History Narrative   ??? Not on file         ALLERGIES: Patient has no known allergies.    Review of Systems   Constitutional: Negative for chills and fever.   Respiratory: Positive for cough and shortness of breath. Negative for chest tightness.    Cardiovascular: Negative for chest pain and leg swelling.       Vitals:    01/27/17 1011 01/27/17 1017   BP: 129/82    Pulse: 90    Resp: 12    Temp: 97.8 ??F (36.6 ??C)    SpO2: 99% 99%   Weight: 126 kg (277 lb 12.5 oz)    Height: 6\' 4"  (1.93 m)             Physical Exam    Constitutional: He is oriented to person, place, and time. He appears well-developed. No distress.   HENT:   Mouth/Throat: Oropharynx is clear and moist. No oropharyngeal exudate or posterior oropharyngeal edema.   Cardiovascular: Normal rate and regular rhythm.   Pulmonary/Chest: Effort normal and breath sounds normal. No accessory muscle usage. No respiratory distress.   Abdominal: Soft. There is no tenderness.   Musculoskeletal: Normal range of motion.        Right lower leg: He exhibits no tenderness and no edema.        Left lower leg: He exhibits no tenderness and no edema.   Lymphadenopathy:     He has no cervical adenopathy.   Neurological: He is alert and oriented to person, place, and time.   Skin: Skin  is warm and dry.   Psychiatric: He has a normal mood and affect.        MDM  Number of Diagnoses or Management Options  Acute bronchitis, unspecified organism:   Acute upper respiratory infection:          Procedures    Pt advised of normal CXR.  I advise him I am unsure of cause of dyspnea but based on sx and normal exam it is most likely viral or bacterial respiratory problem that should improve with treatment within 1 - 2 weeks if not sooner.  I do advise him that tobacco smoking cessation is advised as this may worsen or cause sx to persist.  He is advised to return to ED if sx persist or worsen.  He is advised to follow up with pcp.  Return precautions given and pt dc in stable condition.    Vangie Bicker, MD

## 2017-01-27 NOTE — ED Notes (Cosign Needed)
Pt given Tessalon PO and tolerated.  Pt in no distress; sitting on stretcher, call bell within reach.

## 2017-01-27 NOTE — ED Notes (Signed)
Pt was discharged and given instructions by Dr Mclain .

## 2017-01-27 NOTE — ED Notes (Cosign Needed)
Pt received education about reducing smoking/vaping.  Pt acknowledges information.  Pt in no distress; call bell within reach.

## 2017-01-27 NOTE — ED Triage Notes (Cosign Needed Addendum)
Pt ambulatory to triage in no distress. Pt c/o chest congestion, nasal congestion, dry cough, and SOB when working.  Pt reports s/s intermittently for 1 month.  Pt states that mucinex and flonase have not been helping with s/s.  Pt denies fever, chills, and bodyaches.  Pt reports vomiting when coughing.  Emesis is the food that pt has been eating.

## 2017-01-27 NOTE — ED Notes (Signed)
Pt was discharged and given instructions by Dr Mclain.

## 2017-03-11 ENCOUNTER — Emergency Department
Admit: 2017-03-11 | Payer: BLUE CROSS/BLUE SHIELD | Primary: Student in an Organized Health Care Education/Training Program

## 2017-03-11 ENCOUNTER — Inpatient Hospital Stay
Admit: 2017-03-11 | Discharge: 2017-03-11 | Disposition: A | Discharge status: Home / Self Care | Payer: BLUE CROSS/BLUE SHIELD | Attending: Emergency Medicine

## 2017-03-11 DIAGNOSIS — J209 Acute bronchitis, unspecified: Secondary | ICD-10-CM

## 2017-03-11 LAB — METABOLIC PANEL, COMPREHENSIVE
A-G Ratio: 1.1 (ref 1.1–2.2)
ALT (SGPT): 39 U/L (ref 12–78)
AST (SGOT): 23 U/L (ref 15–37)
Albumin: 4.1 g/dL (ref 3.5–5.0)
Alk. phosphatase: 90 U/L (ref 45–117)
Anion gap: 11 mmol/L (ref 5–15)
BUN/Creatinine ratio: 19 (ref 12–20)
BUN: 18 MG/DL (ref 6–20)
Bilirubin, total: 0.6 MG/DL (ref 0.2–1.0)
CO2: 27 mmol/L (ref 21–32)
Calcium: 8.9 MG/DL (ref 8.5–10.1)
Chloride: 104 mmol/L (ref 97–108)
Creatinine: 0.96 MG/DL (ref 0.70–1.30)
GFR est AA: >60 mL/min/{1.73_m2} (ref >60)
GFR est non-AA: >60 mL/min/{1.73_m2} (ref >60)
Globulin: 3.8 g/dL (ref 2.0–4.0)
Glucose: 90 mg/dL (ref 65–100)
Potassium: 4.1 mmol/L (ref 3.5–5.1)
Protein, total: 7.9 g/dL (ref 6.4–8.2)
Sodium: 142 mmol/L (ref 136–145)

## 2017-03-11 LAB — URINALYSIS W/MICROSCOPIC
Bacteria: NEGATIVE /hpf
Bilirubin: NEGATIVE
Blood: NEGATIVE
Glucose: NEGATIVE mg/dL
Ketone: NEGATIVE mg/dL
Leukocyte Esterase: NEGATIVE
Nitrites: NEGATIVE
Protein: NEGATIVE mg/dL
Specific gravity: 1.02 (ref 1.003–1.030)
Urobilinogen: 0.2 EU/dL (ref 0.2–1.0)
pH (UA): 6.5 (ref 5.0–8.0)

## 2017-03-11 LAB — CBC WITH AUTOMATED DIFF
ABS. BASOPHILS: 0 10*3/uL (ref 0.0–0.1)
ABS. EOSINOPHILS: 0.1 10*3/uL (ref 0.0–0.4)
ABS. LYMPHOCYTES: 1.1 10*3/uL
ABS. MONOCYTES: 0.8 10*3/uL (ref 0.0–1.0)
ABS. NEUTROPHILS: 16.2 10*3/uL — ABNORMAL HIGH (ref 1.8–8.0)
BASOPHILS: 0 % (ref 0–1)
EOSINOPHILS: 1 % (ref 0–7)
HCT: 50.2 % (ref 36.6–50.3)
HGB: 17.1 g/dL — ABNORMAL HIGH (ref 12.1–17.0)
LYMPHOCYTES: 6 % — ABNORMAL LOW (ref 12–49)
MCH: 29.7 PG (ref 26.0–34.0)
MCHC: 34.1 g/dL (ref 30.0–36.5)
MCV: 87.2 FL (ref 80.0–99.0)
MONOCYTES: 5 % (ref 5–13)
MPV: 10.4 FL (ref 8.9–12.9)
NEUTROPHILS: 88 % — ABNORMAL HIGH (ref 32–75)
PLATELET: 319 10*3/uL (ref 150–400)
RBC: 5.76 M/uL — ABNORMAL HIGH (ref 4.10–5.70)
RDW: 13.6 % (ref 11.5–14.5)
WBC: 18.2 10*3/uL — ABNORMAL HIGH (ref 4.1–11.1)
XXWBCSUS: 0

## 2017-03-11 LAB — D DIMER: D-dimer: 0.27 mg/L FEU (ref 0.00–0.65)

## 2017-03-11 LAB — URINE CULTURE HOLD SAMPLE

## 2017-03-11 LAB — SAMPLES BEING HELD

## 2017-03-11 LAB — LIPASE: Lipase: 54 U/L — ABNORMAL LOW (ref 73–393)

## 2017-03-11 LAB — D-DIMER, QUANTITATIVE: D-Dimer, Quant: 0.27 mg/L FEU (ref 0.00–0.65)

## 2017-03-11 MED ORDER — ONDANSETRON 4 MG TAB, RAPID DISSOLVE
4 mg | ORAL_TABLET | Freq: Three times a day (TID) | ORAL | 0 refills | Status: AC | PRN
Start: 2017-03-11 — End: ?

## 2017-03-11 MED ORDER — FAMOTIDINE (PF) 20 MG/2 ML IV
20 mg/2 mL | INTRAVENOUS | Status: AC
Start: 2017-03-11 — End: 2017-03-11

## 2017-03-11 MED ORDER — POLYETHYLENE GLYCOL 3350 100 % ORAL POWDER
17 gram/dose | Freq: Every day | ORAL | 0 refills | Status: AC
Start: 2017-03-11 — End: 2017-03-15

## 2017-03-11 MED ORDER — ONDANSETRON (PF) 4 MG/2 ML INJECTION
4 mg/2 mL | INTRAMUSCULAR | Status: DC
Start: 2017-03-11 — End: 2017-03-11
  Administered 2017-03-11: 15:00:00 via INTRAVENOUS

## 2017-03-11 MED ORDER — SODIUM CHLORIDE 0.9% BOLUS IV
0.9 % | INTRAVENOUS | Status: AC
Start: 2017-03-11 — End: 2017-03-11
  Administered 2017-03-11: 15:00:00 via INTRAVENOUS

## 2017-03-11 MED ORDER — DOXYCYCLINE HYCLATE 100 MG TAB
100 mg | ORAL_TABLET | Freq: Two times a day (BID) | ORAL | 0 refills | Status: AC
Start: 2017-03-11 — End: 2017-03-18

## 2017-03-11 MED ORDER — FAMOTIDINE 20 MG TAB
20 mg | ORAL_TABLET | Freq: Two times a day (BID) | ORAL | 0 refills | Status: AC
Start: 2017-03-11 — End: ?

## 2017-03-11 MED ORDER — FAMOTIDINE (PF) 20 MG/2 ML IV
20 mg/2 mL | INTRAVENOUS | Status: AC
Start: 2017-03-11 — End: 2017-03-11
  Administered 2017-03-11: 15:00:00 via INTRAVENOUS

## 2017-03-11 MED ORDER — ONDANSETRON (PF) 4 MG/2 ML INJECTION
4 mg/2 mL | INTRAMUSCULAR | Status: AC
Start: 2017-03-11 — End: 2017-03-11
  Administered 2017-03-11: 15:00:00

## 2017-03-11 MED FILL — FAMOTIDINE (PF) 20 MG/2 ML IV: 20 mg/2 mL | INTRAVENOUS | Qty: 2

## 2017-03-11 MED FILL — SODIUM CHLORIDE 0.9 % IV: INTRAVENOUS | Qty: 1000

## 2017-03-11 MED FILL — ONDANSETRON (PF) 4 MG/2 ML INJECTION: 4 mg/2 mL | INTRAMUSCULAR | Qty: 2

## 2017-03-11 NOTE — ED Notes (Signed)
The patient was discharged home by Dr. Dimaio.  Pt ambulatory to discharge.

## 2017-03-11 NOTE — ED Notes (Signed)
Pt resting on the stretcher in no distress. Pt tolerating PO water without difficulty.  All results available for review and awaiting further care mgmt.  Will continue to monitor.

## 2017-03-11 NOTE — ED Triage Notes (Signed)
Pt ambulatory to treatment area with c/o "vomiting, SOB and sore throat off and on for 2 weeks."  Pt is unsure of fevers.  Pt denies abdominal pain, chest pain.  Pt reports "diarrhea that is normal for me."  Dr. Maricela Curetimaio at bedside to evaluate.

## 2017-03-11 NOTE — ED Notes (Signed)
Pt given water to attempt PO challenge.  NO vomiting since arrival.

## 2017-03-11 NOTE — ED Provider Notes (Signed)
27 yo male presents with c/o cough, vomiting and sore throat. Reports started with a cough productive of green sputum 2 weeks ago. Has had some post tussive emesis but also emesis without coughing. Today threw up x 2 at work. Denies abdominal pain, blood in emesis or stool. dendies black tarry stool. Reports he does tend to be constipated and has a bm every 3-4 days. Last bm was this morning. Reports subjective fever. States sore throat is post vomiting. Denies  recent car trip, airplane trip, leg swelling, hx dvt or PE    + smoker, denies marijuana use, denies etoh             Past Medical History:   Diagnosis Date   ??? Migraines        Past Surgical History:   Procedure Laterality Date   ??? HX ORTHOPAEDIC      Left hand         History reviewed. No pertinent family history.    Social History     Socioeconomic History   ??? Marital status: SINGLE     Spouse name: Not on file   ??? Number of children: Not on file   ??? Years of education: Not on file   ??? Highest education level: Not on file   Social Needs   ??? Financial resource strain: Not on file   ??? Food insecurity - worry: Not on file   ??? Food insecurity - inability: Not on file   ??? Transportation needs - medical: Not on file   ??? Transportation needs - non-medical: Not on file   Occupational History   ??? Not on file   Tobacco Use   ??? Smoking status: Current Every Day Smoker     Packs/day: 1.00   ??? Tobacco comment: Pt also vapes.   Substance and Sexual Activity   ??? Alcohol use: No   ??? Drug use: No   ??? Sexual activity: Not on file   Other Topics Concern   ??? Not on file   Social History Narrative   ??? Not on file         ALLERGIES: Patient has no known allergies.    Review of Systems   Constitutional: Positive for fever.   HENT: Positive for sore throat.    Respiratory: Positive for cough and shortness of breath.    Cardiovascular: Negative for chest pain.   Gastrointestinal: Positive for constipation and vomiting. Negative for abdominal pain.    Genitourinary: Negative for difficulty urinating.   Neurological: Negative for dizziness and headaches.   All other systems reviewed and are negative.      Vitals:    03/11/17 0917 03/11/17 0918   BP: 148/83    Pulse: (!) (P) 104    Resp: (P) 16    Temp: (P) 98.2 ??F (36.8 ??C)    SpO2: (P) 100% 98%            Physical Exam   Constitutional: He is oriented to person, place, and time. He appears well-developed and well-nourished.   HENT:   Head: Normocephalic and atraumatic.   Right Ear: External ear normal.   Left Ear: External ear normal.   Nose: Nose normal.   Mouth/Throat: Oropharynx is clear and moist. No oropharyngeal exudate.   Eyes: Conjunctivae are normal.   Neck: Normal range of motion.   Cardiovascular: Regular rhythm, normal heart sounds and intact distal pulses.   No murmur heard.  tachycardic   Pulmonary/Chest: Effort normal and  breath sounds normal. No stridor. No bradypnea. No respiratory distress. He has no wheezes. He has no rales.   Abdominal: Soft. Bowel sounds are normal. He exhibits no distension and no mass. There is tenderness. There is no guarding.   Minimal epigastric ttp   Musculoskeletal: Normal range of motion. He exhibits no edema or tenderness.   Neurological: He is alert and oriented to person, place, and time.   Skin: Skin is warm and dry.   Nursing note and vitals reviewed.       MDM  Number of Diagnoses or Management Options  Acute bronchitis, unspecified organism:   Gastritis and duodenitis:   Non-intractable vomiting with nausea, unspecified vomiting type:   Smoking addiction:   Diagnosis management comments: Bronchitis vs PNA, vs pancreatitis vs chollithiasis vs gastritis vs denhdyration vs uti vs constipation vs viral syndrome    No sx of sbo, nonsurgical abdomen    Hr elevated- same in past no risk factors for pe. Suspect may be related to dehydration        Amount and/or Complexity of Data Reviewed  Clinical lab tests: ordered and reviewed   Tests in the radiology section of CPT??: ordered and reviewed  Review and summarize past medical records: yes (Prior visit in 05/06/2012 for similar sx which resolved and pt was dc home )  Independent visualization of images, tracings, or specimens: yes (ekg)    Patient Progress  Patient progress: stable         Procedures    ED EKG interpretation:  Rhythm: sinus tachycardia; and regular . Rate (approx.): 110; Axis: normal; P wave: normal; QRS interval: normal ; ST/T wave: non-specific changes; sinus tachycardia and q waves in 3 and avf unchanged from 07/05/12  EKG documented by Pierre BaliAlexis A Ghadeer Kastelic, MD, scribe, as interpreted by Pierre BaliiMaio, Issaih Kaus A, MD, ED MD.    10:21 AM  Pt reports feeling much better. No longer sob. No abdominal pain. Hr improved with iv fluids. No coughing in ED. Advised smoking cessation. Was just treated with zpack which would cover pertussis in January. Will rx doxycycline. Wbc elevated in the past and is not new. Discussed antacid and diet control and well as bowel regimen and follow up.   10:49 AM  Patient's results have been reviewed with them.  Patient and/or family have verbally conveyed their understanding and agreement of the patient's signs, symptoms, diagnosis, treatment and prognosis and additionally agree to follow up as recommended or return to the Emergency Room should their condition change prior to follow-up.  Discharge instructions have also been provided to the patient with some educational information regarding their diagnosis as well a list of reasons why they would want to return to the ER prior to their follow-up appointment should their condition change.

## 2017-03-11 NOTE — ED Notes (Signed)
Pt to xray via stretcher.

## 2017-03-12 LAB — EKG, 12 LEAD, INITIAL
Atrial Rate: 108 {beats}/min
Calculated P Axis: 39 degrees
Calculated R Axis: 57 degrees
Calculated T Axis: 38 degrees
P-R Interval: 154 ms
Q-T Interval: 318 ms
QRS Duration: 80 ms
QTC Calculation (Bezet): 426 ms
Ventricular Rate: 108 {beats}/min

## 2017-03-12 LAB — EKG 12-LEAD
Atrial Rate: 108 {beats}/min
P Axis: 39 degrees
P-R Interval: 154 ms
Q-T Interval: 318 ms
QRS Duration: 80 ms
QTc Calculation (Bazett): 426 ms
R Axis: 57 degrees
T Axis: 38 degrees
Ventricular Rate: 108 {beats}/min

## 2017-03-31 ENCOUNTER — Emergency Department

## 2017-03-31 ENCOUNTER — Emergency Department
Admit: 2017-03-31 | Payer: BLUE CROSS/BLUE SHIELD | Primary: Student in an Organized Health Care Education/Training Program

## 2017-03-31 ENCOUNTER — Inpatient Hospital Stay
Admit: 2017-03-31 | Discharge: 2017-03-31 | Disposition: A | Discharge status: Home / Self Care | Payer: BLUE CROSS/BLUE SHIELD | Attending: Emergency Medicine

## 2017-03-31 DIAGNOSIS — J039 Acute tonsillitis, unspecified: Secondary | ICD-10-CM

## 2017-03-31 LAB — METABOLIC PANEL, BASIC
Anion gap: 10 mmol/L (ref 5–15)
BUN/Creatinine ratio: 19 (ref 12–20)
BUN: 15 MG/DL (ref 6–20)
CO2: 26 mmol/L (ref 21–32)
Calcium: 8.9 MG/DL (ref 8.5–10.1)
Chloride: 104 mmol/L (ref 97–108)
Creatinine: 0.8 MG/DL (ref 0.70–1.30)
GFR est AA: >60 mL/min/{1.73_m2} (ref >60)
GFR est non-AA: >60 mL/min/{1.73_m2} (ref >60)
Glucose: 92 mg/dL (ref 65–100)
Potassium: 4 mmol/L (ref 3.5–5.1)
Sodium: 140 mmol/L (ref 136–145)

## 2017-03-31 LAB — CBC WITH AUTOMATED DIFF
ABS. BASOPHILS: 0 10*3/uL (ref 0.0–0.1)
ABS. EOSINOPHILS: 0.1 10*3/uL (ref 0.0–0.4)
ABS. LYMPHOCYTES: 3.1 10*3/uL
ABS. MONOCYTES: 0.6 10*3/uL (ref 0.0–1.0)
ABS. NEUTROPHILS: 6.5 10*3/uL (ref 1.8–8.0)
BASOPHILS: 0 % (ref 0–1)
EOSINOPHILS: 1 % (ref 0–7)
HCT: 44.2 % (ref 36.6–50.3)
HGB: 14.9 g/dL (ref 12.1–17.0)
LYMPHOCYTES: 30 % (ref 12–49)
MCH: 29.6 PG (ref 26.0–34.0)
MCHC: 33.7 g/dL (ref 30.0–36.5)
MCV: 87.9 FL (ref 80.0–99.0)
MONOCYTES: 6 % (ref 5–13)
MPV: 10.3 FL (ref 8.9–12.9)
NEUTROPHILS: 63 % (ref 32–75)
PLATELET: 278 10*3/uL (ref 150–400)
RBC: 5.03 M/uL (ref 4.10–5.70)
RDW: 13.3 % (ref 11.5–14.5)
WBC: 10.4 10*3/uL (ref 4.1–11.1)

## 2017-03-31 LAB — MONONUCLEOSIS SCREEN: Mononucleosis screen: NEGATIVE

## 2017-03-31 LAB — STREP AG SCREEN, GROUP A: Group A Strep Ag ID: NEGATIVE

## 2017-03-31 MED ORDER — AMOXICILLIN 500 MG TABLET
500 mg | ORAL_TABLET | Freq: Three times a day (TID) | ORAL | 0 refills | Status: AC
Start: 2017-03-31 — End: 2017-04-10

## 2017-03-31 MED ORDER — IOPAMIDOL 61 % IV SOLN
300 mg iodine /mL (61 %) | Freq: Once | INTRAVENOUS | Status: AC
Start: 2017-03-31 — End: 2017-03-31
  Administered 2017-03-31: 17:00:00 via INTRAVENOUS

## 2017-03-31 MED ORDER — DEXAMETHASONE SODIUM PHOSPHATE (PF) 10 MG/ML INJECTION
10 mg/mL | Freq: Once | INTRAMUSCULAR | Status: AC
Start: 2017-03-31 — End: 2017-03-31
  Administered 2017-03-31: 16:00:00 via INTRAVENOUS

## 2017-03-31 MED ORDER — METHYLPREDNISOLONE 4 MG TABS IN A DOSE PACK
4 mg | ORAL | 0 refills | Status: AC
Start: 2017-03-31 — End: ?

## 2017-03-31 MED FILL — DEXAMETHASONE SODIUM PHOSPHATE (PF) 10 MG/ML INJECTION: 10 mg/mL | INTRAMUSCULAR | Qty: 1

## 2017-03-31 NOTE — ED Provider Notes (Addendum)
Jack Koch is a 27 y.o. male who presents ambulatory to the ED with  C/o swollen tonsils that started today. Patient states when he woke up this morning he noticed swollen tonsils with difficulty swallowing. Patient states he was able to eat in small bites and slowly. Patient states he can drink but slowly swallowing as well. Patient is a smoker and has smoked this morning saying he has to take "smaller drags than normal."  Patient states he has been "battling a cold for about two months and his family all have been sick as well." Denies any fever, positive chills, no nausea or vomiting. Denies any sore throat.  Denies any chest pain,shortness of breath or dizziness. No ear pain or ear discharge.     PCP: [facility], Not On File    PMHx significant for: Past Medical History:  No date: Migraines  No date: Neurological disorder      Comment:  migrains    PSHx significant for: Past Surgical History:  No date: HX ORTHOPAEDIC      Comment:  Left hand    There are no further complaints or symptoms at this time.                Past Medical History:   Diagnosis Date   ??? Migraines    ??? Neurological disorder     migrains       Past Surgical History:   Procedure Laterality Date   ??? HX ORTHOPAEDIC      Left hand         History reviewed. No pertinent family history.    Social History     Socioeconomic History   ??? Marital status: SINGLE     Spouse name: Not on file   ??? Number of children: Not on file   ??? Years of education: Not on file   ??? Highest education level: Not on file   Social Needs   ??? Financial resource strain: Not on file   ??? Food insecurity - worry: Not on file   ??? Food insecurity - inability: Not on file   ??? Transportation needs - medical: Not on file   ??? Transportation needs - non-medical: Not on file   Occupational History   ??? Not on file   Tobacco Use   ??? Smoking status: Current Every Day Smoker     Packs/day: 1.00   ??? Tobacco comment: Pt also vapes.   Substance and Sexual Activity   ??? Alcohol use: No      Frequency: Never   ??? Drug use: No   ??? Sexual activity: Not on file   Other Topics Concern   ??? Not on file   Social History Narrative   ??? Not on file         ALLERGIES: Patient has no known allergies.    Review of Systems   Constitutional: Positive for chills. Negative for activity change, appetite change, fatigue and fever.   HENT: Positive for trouble swallowing ("swolen tonsils"). Negative for congestion, ear discharge, ear pain, sinus pressure, sinus pain and sore throat.    Eyes: Negative for photophobia, pain, redness, itching and visual disturbance.   Respiratory: Negative for chest tightness and shortness of breath.    Cardiovascular: Negative for chest pain and palpitations.   Gastrointestinal: Negative for abdominal distention, abdominal pain, nausea and vomiting.   Endocrine: Negative.    Genitourinary: Negative for difficulty urinating, frequency and urgency.   Musculoskeletal: Negative for back pain, neck pain  and neck stiffness.   Skin: Negative for color change, pallor, rash and wound.   Allergic/Immunologic: Negative.    Neurological: Negative for dizziness, syncope, weakness and headaches.   Hematological: Does not bruise/bleed easily.   Psychiatric/Behavioral: Negative for behavioral problems. The patient is not nervous/anxious.        Vitals:    03/31/17 1145   BP: 124/61   Pulse: 85   Resp: 12   Temp: 98.3 ??F (36.8 ??C)   SpO2: 98%            Physical Exam   Constitutional: He is oriented to person, place, and time. He appears well-developed and well-nourished. No distress.   HENT:   Head: Normocephalic and atraumatic.   Right Ear: Hearing and external ear normal. No drainage.   Left Ear: Hearing and external ear normal. No drainage.   Nose: Nose normal.   Mouth/Throat: Uvula is midline, oropharynx is clear and moist and mucous membranes are normal. Tonsils are 4+ on the right. Tonsils are 3+ on the left.   Unable to visualize TM in left ear due to cerumen. Patient states "it is  always like that."   Eyes: Conjunctivae and EOM are normal. Pupils are equal, round, and reactive to light. Right eye exhibits no discharge. Left eye exhibits no discharge.   Neck: Normal range of motion. Neck supple. No JVD present. No tracheal deviation present.   Cardiovascular: Normal rate, regular rhythm, normal heart sounds and intact distal pulses. Exam reveals no gallop.   No murmur heard.  Pulmonary/Chest: Effort normal and breath sounds normal. No respiratory distress. He has no wheezes. He has no rales. He exhibits no tenderness.   Abdominal: Soft. Bowel sounds are normal. He exhibits no distension. There is no tenderness. There is no rebound and no guarding.   Genitourinary:   Genitourinary Comments: Negative     Musculoskeletal: Normal range of motion. He exhibits no edema or tenderness.   Lymphadenopathy:     He has cervical adenopathy.   Neurological: He is alert and oriented to person, place, and time.   Skin: Skin is warm and dry. No rash noted. No erythema. No pallor.   Psychiatric: He has a normal mood and affect. His behavior is normal. Judgment and thought content normal.   Nursing note and vitals reviewed.       MDM  Number of Diagnoses or Management Options  Acute tonsillitis, unspecified etiology: new and requires workup  Diagnosis management comments: Plan:  Discharge to home and follow up with PCP, follow up with ENT as an outpatient.   Return to ED with worsening symptoms.       Amount and/or Complexity of Data Reviewed  Clinical lab tests: reviewed and ordered  Tests in the radiology section of CPT??: ordered and reviewed  Discuss the patient with other providers: yes (Discussed plan of care with Dr. Leonard Schwartz)         2:34 PM  Pt has been reexamined.  Pt has no new complaints, changes or physical findings. Care plan outlined and precautions discussed. All available results were reviewed with pt. All medications were reviewed with pt. All  of pt's questions and concerns were addressed. Pt agrees to F/U as instructed and agrees to return to ED upon further deterioration. Pt is ready to go home.  Magnus Sinning Richio, NP      Procedures      I was personally available for consultation in the emergency department.  I have reviewed the  chart and agree with the documentation recorded by the [facility], including the assessment, treatment plan, and disposition.  Debbe Bales, MD

## 2017-03-31 NOTE — ED Notes (Signed)
Patient resting on the stretcher in no distress and currently without complaints. Call bell within reach; will continue to monitor.

## 2017-03-31 NOTE — ED Notes (Signed)
Patient was completely asleep and needed much prodding to awaken to obtain VS. Claimed to have no pain at this time.

## 2017-03-31 NOTE — ED Triage Notes (Signed)
Pt ambulated to the treatment area with a steady gait. Pt states "I woke up this morning with swollen Tonsils. No fever that I know of, no sore throat just hurts when I swallow." Pt appears in no distress at this time. Respirations even and non labored.

## 2017-03-31 NOTE — ED Notes (Signed)
The patient was discharged home by Richio, NP in stable condition. The patient is alert and oriented, in no respiratory distress. The patient's diagnosis, condition and treatment were explained. The patient expressed understanding. A discharge plan has been developed. A case manager was not involved in the process. Aftercare instructions were given.  Pt ambulatory out of the ED.

## 2017-04-02 LAB — CULTURE, THROAT: Culture result:: NORMAL

## 2018-08-18 ENCOUNTER — Emergency Department (HOSPITAL_COMMUNITY): Payer: BC Managed Care – PPO

## 2018-08-18 ENCOUNTER — Emergency Department (HOSPITAL_COMMUNITY)
Admission: EM | Admit: 2018-08-18 | Discharge: 2018-08-18 | Disposition: A | Discharge status: Home / Self Care | Payer: BC Managed Care – PPO | Attending: Emergency Medicine | Admitting: Emergency Medicine

## 2018-08-18 ENCOUNTER — Encounter (HOSPITAL_COMMUNITY): Payer: Self-pay | Admitting: *Deleted

## 2018-08-18 DIAGNOSIS — Y998 Other external cause status: Secondary | ICD-10-CM | POA: Diagnosis not present

## 2018-08-18 DIAGNOSIS — R42 Dizziness and giddiness: Secondary | ICD-10-CM | POA: Insufficient documentation

## 2018-08-18 DIAGNOSIS — Y9389 Activity, other specified: Secondary | ICD-10-CM | POA: Insufficient documentation

## 2018-08-18 DIAGNOSIS — W228XXA Striking against or struck by other objects, initial encounter: Secondary | ICD-10-CM | POA: Diagnosis not present

## 2018-08-18 DIAGNOSIS — S060X0A Concussion without loss of consciousness, initial encounter: Secondary | ICD-10-CM | POA: Insufficient documentation

## 2018-08-18 DIAGNOSIS — Y929 Unspecified place or not applicable: Secondary | ICD-10-CM | POA: Diagnosis not present

## 2018-08-18 DIAGNOSIS — S0990XA Unspecified injury of head, initial encounter: Secondary | ICD-10-CM | POA: Diagnosis present

## 2018-08-18 NOTE — ED Provider Notes (Signed)
[facility]   CSN: [id number] Arrival date & time: 08/18/18  1733    History   Chief Complaint Chief Complaint  Patient presents with  . Headache  . Head Injury    HPI Erik Stevens is a 28 y.o. male.     HPI  Pt is a 28 y/o male who presents to the ED today for eval of head injury that occurred yesterday. States that he was strapping a bar down to a truck when the bar slipped and swung and hitting him in the head. The bar was about 25 lbs. He was hit on the right side of his head on the temple. He denies LOC. He reports a severe headache, nausea, and intermittent dizziness. Denies any visual changes, numbness/weakness. Denies vomiting. He denies any balance issues, speech changes or facial droop. Denies any memory issues. Has small abrasion to the right side of the head. Last tdap was 3 years ago.   History reviewed. No pertinent past medical history.  There are no active problems to display for this patient.   History reviewed. No pertinent surgical history.      Home Medications    Prior to Admission medications   Not on File    Family History No family history on file.  Social History Social History   Tobacco Use  . Smoking status: Not on file  Substance Use Topics  . Alcohol use: Not on file  . Drug use: Not on file     Allergies   Patient has no known allergies.   Review of Systems Review of Systems  Constitutional: Negative for fever.  Gastrointestinal: Positive for nausea. Negative for vomiting.  Musculoskeletal: Negative for back pain and neck pain.  Skin: Positive for wound.  Neurological: Positive for dizziness and headaches. Negative for seizures, facial asymmetry, speech difficulty, weakness, light-headedness and numbness.    Physical Exam Updated Vital Signs BP 125/80 (BP Location: Right Arm)   Pulse 81   Temp 98.9 F (37.2 C) (Oral)   Resp 18   SpO2 100%   Physical Exam  Vitals signs and nursing note reviewed.  Constitutional:      Appearance: He is well-developed.  HENT:     Head: Normocephalic and atraumatic.  Eyes:     Conjunctiva/sclera: Conjunctivae normal.  Neck:     Musculoskeletal: Neck supple.  Cardiovascular:     Rate and Rhythm: Normal rate and regular rhythm.     Heart sounds: No murmur.  Pulmonary:     Effort: Pulmonary effort is normal. No respiratory distress.     Breath sounds: Normal breath sounds.  Abdominal:     Palpations: Abdomen is soft.     Tenderness: There is no abdominal tenderness.  Skin:    General: Skin is warm and dry.  Neurological:     Mental Status: He is alert.     Comments: Mental Status:  Alert, thought content appropriate, able to give a coherent history. Speech fluent without evidence of aphasia. Able to follow 2 step commands without difficulty.  Cranial Nerves:  II:  pupils equal, round, reactive to light III,IV, VI: ptosis not present, extra-ocular motions intact bilaterally  V,VII: smile symmetric, facial light touch sensation equal VIII: hearing grossly normal to voice  X: uvula elevates symmetrically  XI: bilateral shoulder shrug symmetric and strong XII: midline tongue extension without fassiculations Motor:  Normal tone. 5/5 strength of BUE and BLE major muscle groups including strong and equal  grip strength and dorsiflexion/plantar flexion Sensory: light touch normal in all extremities. Cerebellar: normal finger-to-nose with bilateral upper extremities Gait: normal gait and balance. Able to walk on toes and heels with ease.      ED Treatments / Results  Labs (all labs ordered are listed, but only abnormal results are displayed) Labs Reviewed - No data to display  EKG None  Radiology Ct Head Wo Contrast  Result Date: 08/18/2018 CLINICAL DATA:  Headache EXAM: CT HEAD WITHOUT CONTRAST TECHNIQUE: Contiguous axial images were obtained from the base of the skull through the vertex without  intravenous contrast. COMPARISON:  None. FINDINGS: Brain: No acute territorial infarction, hemorrhage or intracranial mass. The ventricles are nonenlarged Vascular: No hyperdense vessel or unexpected calcification. Skull: Normal. Negative for fracture or focal lesion. Sinuses/Orbits: Mucosal thickening in the ethmoid sinuses Other: None IMPRESSION: Negative non contrasted CT appearance of the brain Electronically Signed   By: Donavan Foil M.D.   On: 08/18/2018 22:50    Procedures Procedures (including critical care time)  Medications Ordered in ED Medications - No data to display   Initial Impression / Assessment and Plan / ED Course  I have reviewed the triage vital signs and the nursing notes.  Pertinent labs & imaging results that were available during my care of the patient were reviewed by me and considered in my medical decision making (see chart for details).    Final Clinical Impressions(s) / ED Diagnoses   Final diagnoses:  Concussion without loss of consciousness, initial encounter   28 year old male presenting for evaluation of headache, intermittent dizziness, nausea after being hit in the head yesterday by a 25 pound metal pole.  Did not lose consciousness.  Is not on blood thinners.  He has a normal neurologic exam however given the force with which he was hit, there is concern for possibility of intracranial bleeding therefore head CT obtained.  CT scan of the head did not show any evidence of bleeding.,  Head CT was normal.  Reevaluated patient.  Informed him of the results.  Discussed symptoms are likely related to postconcussive syndrome.  Advised that he can take Tylenol for his symptoms.  Advised to avoid aspirin and other activities that could lead to reinjury.  He will be given information to follow-up with the concussion clinic.  He was advised on specific return precautions.  He voices understanding of the plan and reasons to return.  All questions answered.   Patient stable for discharge.  ED Discharge Orders    None       Bishop Dublin 08/18/18 [place], Ankit, MD 08/19/18 [phone]

## 2018-08-18 NOTE — ED Triage Notes (Signed)
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 States he fell to ground. Nausea yesterday - none today. Speaking in full complete sentences. Appears in nad. Pupils round at 2mm, equal, react brisk to light.

## 2018-08-18 NOTE — Discharge Instructions (Signed)
HEAD INJURY °If any of the following occur notify your physician or go to °the Hospital Emergency Department: ° Increased drowsiness, stupor or loss of consciousness ° Restlessness or convulsions (fits) ° Paralysis in arms or legs ° Temperature above 100º F ° Vomiting ° Severe headache ° Blood or clear fluid dripping from the nose or ears ° Stiffness of the neck ° Dizziness or blurred vision ° Pulsating pain in the eye ° Unequal pupils of eye ° Personality changes ° Any other unusual symptoms °PRECAUTIONS ° Do not take tranquilizers, sedatives, narcotics or alcohol ° Avoid aspirin. Use only acetaminophen (e.g. Tylenol) or °ibuprofen (e.g. Advil) for relief of pain. Follow directions °on the bottle for dosage. ° Use ice packs for comfort ° Getting plenty of rest and sleep helps the brain to heal. Do not try to do too much too fast. As you start to feel better, you can slowly and gradually return to your usual routine. ° Avoid activities that are physically demanding (e.g., sports, heavy housecleaning, exercising) or require a lot of thinking or concentration (e.g., working on the computer, playing video games). ° Ask your health care professional when you can safely drive a car, ride a bike, or operate heavy equipment. °MEDICATIONS °Use medications only as directed by your physician ° °Follow up with your primary care provider within 5-7 days for re-evaluation.  ° °

## 2020-12-05 IMAGING — CT CT HEAD WITHOUT CONTRAST
4 series · 17 of 47 positions shown, 19 images · non-contrast
Comparison: None.

CLINICAL DATA: Headache

EXAM:
CT HEAD WITHOUT CONTRAST
TECHNIQUE: Contiguous axial images were obtained from the base of the skull
through the vertex without intravenous contrast.

[Series 3: head wo · axial · 0.46mm/px · z∈[-110,+20]mm · 7 of 36 slices shown, 9 images]
[im 5/36  brain]
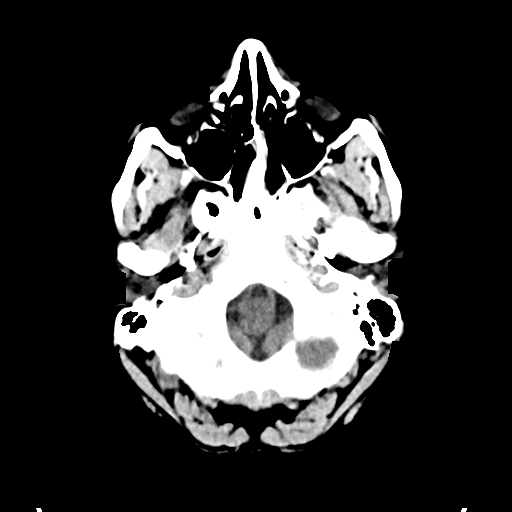
[im 5/36  bone]
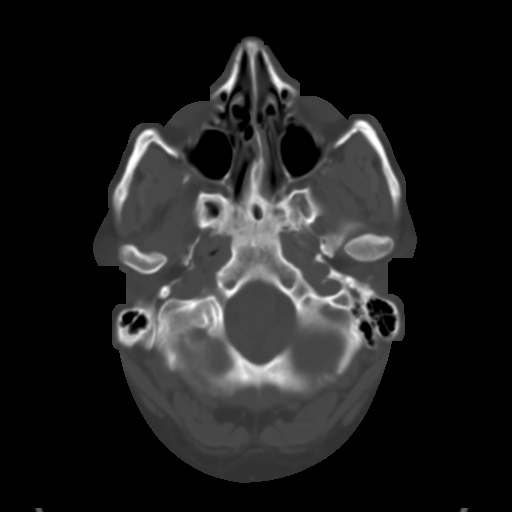
[im 9/36  brain]
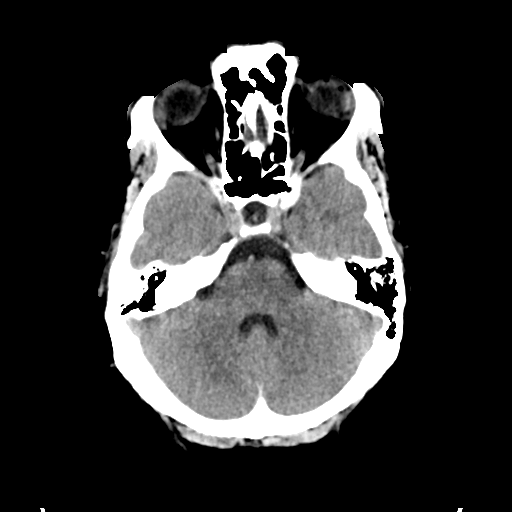
[im 14/36  brain]
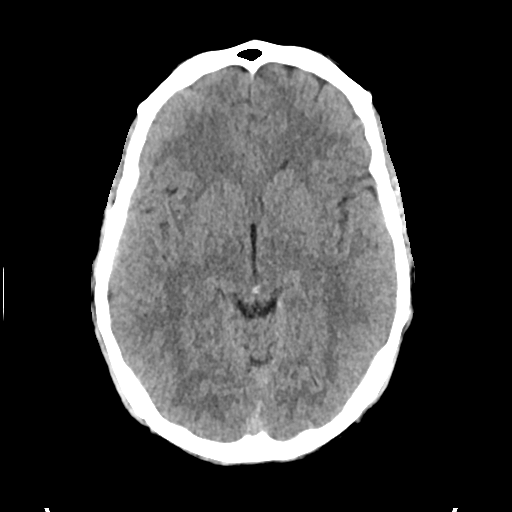
[im 18/36  brain]
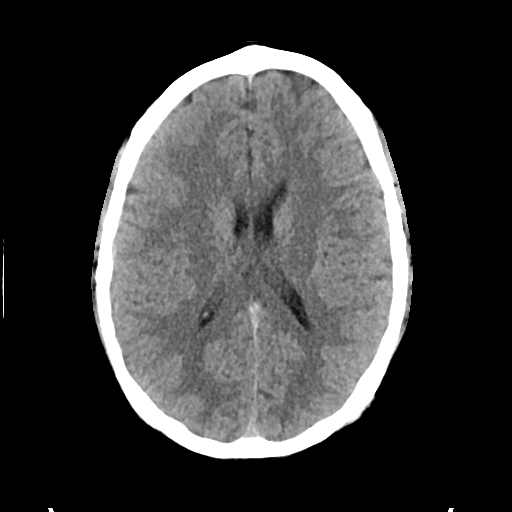
[im 22/36  brain]
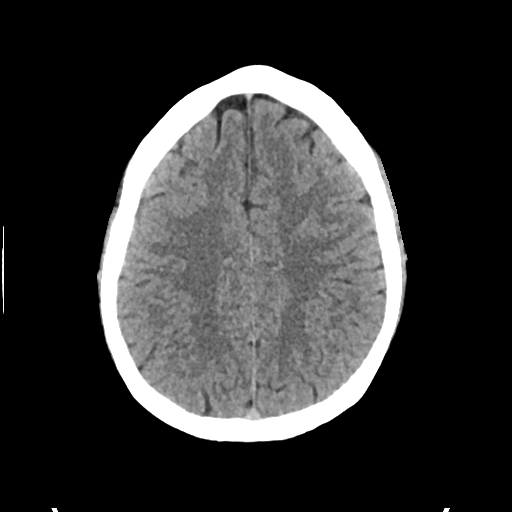
[im 22/36  bone]
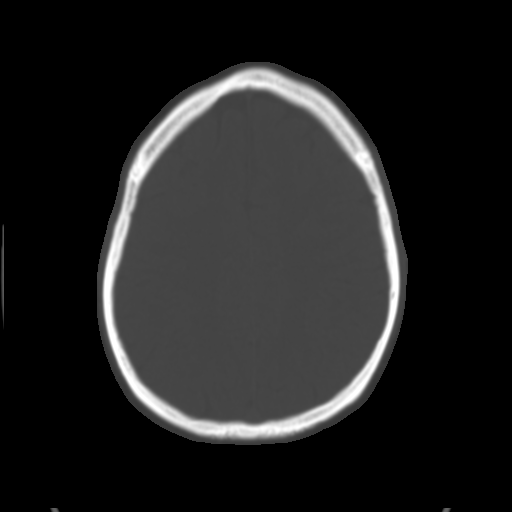
[im 27/36  brain]
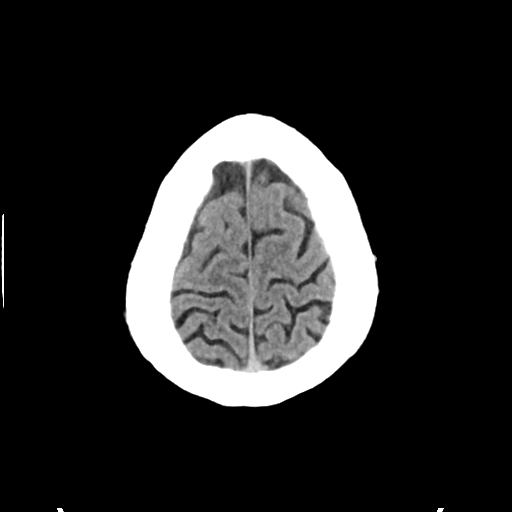
[im 31/36  brain]
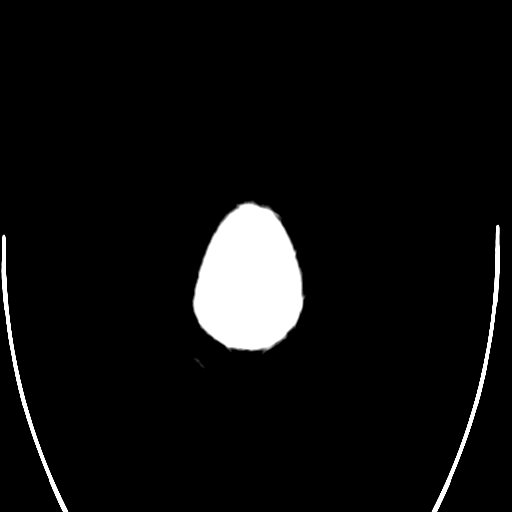

[Series 4: head bone · axial · 0.46mm/px · z∈[-114,-52]mm · 4 of 89 slices shown]
[im 9/89  bone]
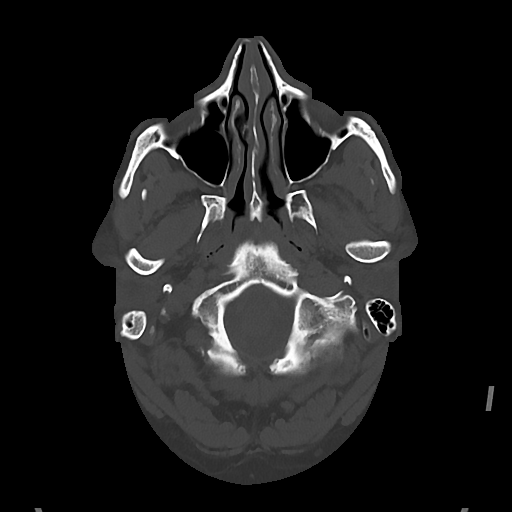
[im 18/89  bone]
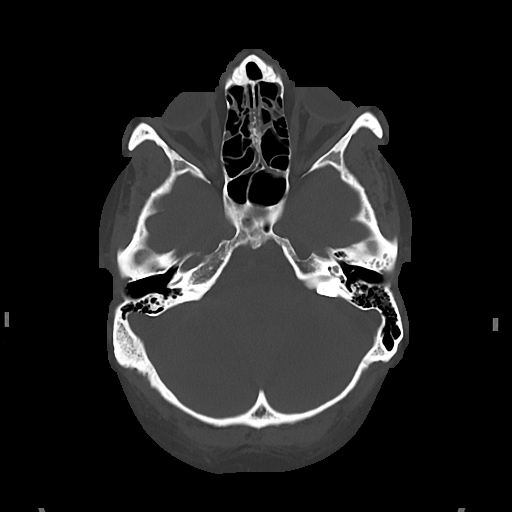
[im 27/89  bone]
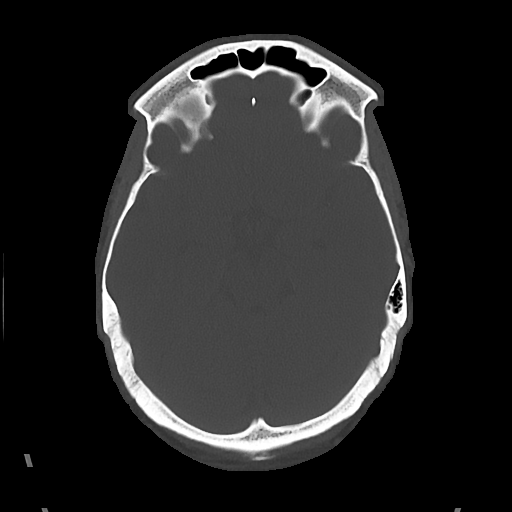
[im 40/89  bone]
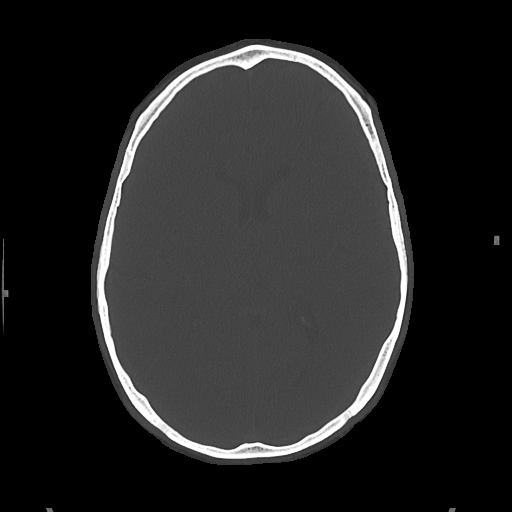

[Series 5: cor soft · coronal · 0.35mm/px · 3 of 76 slices shown]
[im 26/76  brain]
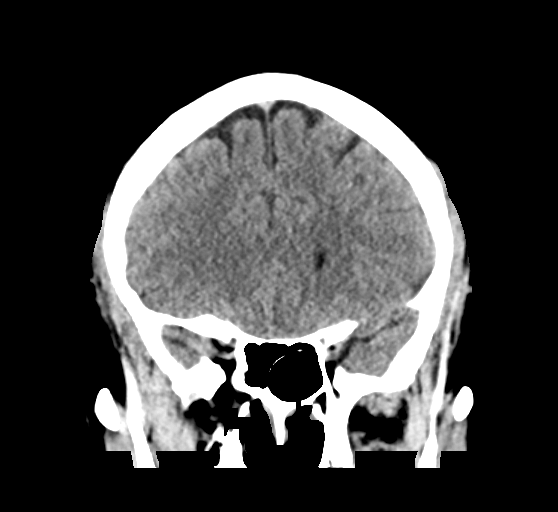
[im 34/76  brain]
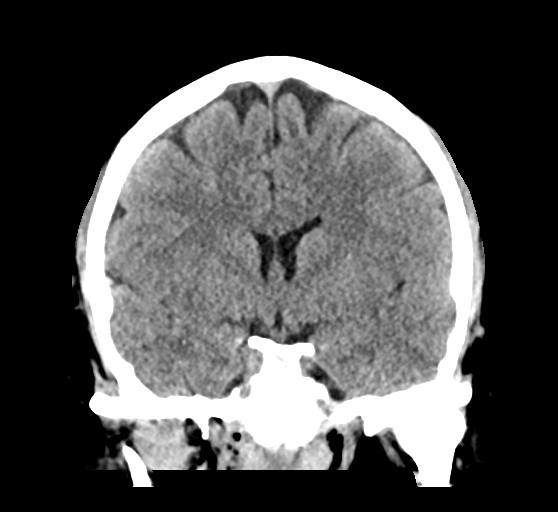
[im 42/76  brain]
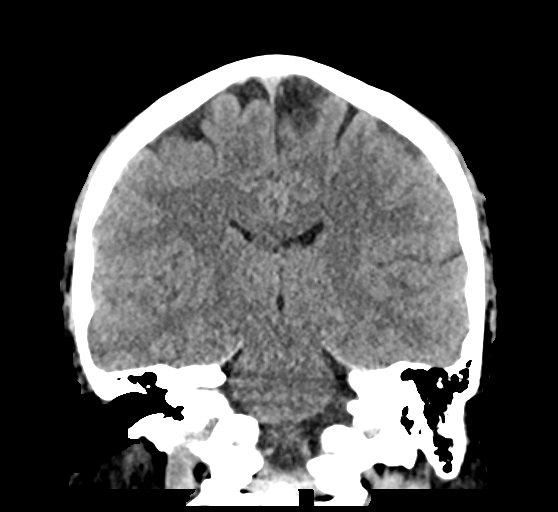

[Series 6: sag soft · sagittal · 0.35mm/px · 3 of 67 slices shown]
[im 23/67  brain]
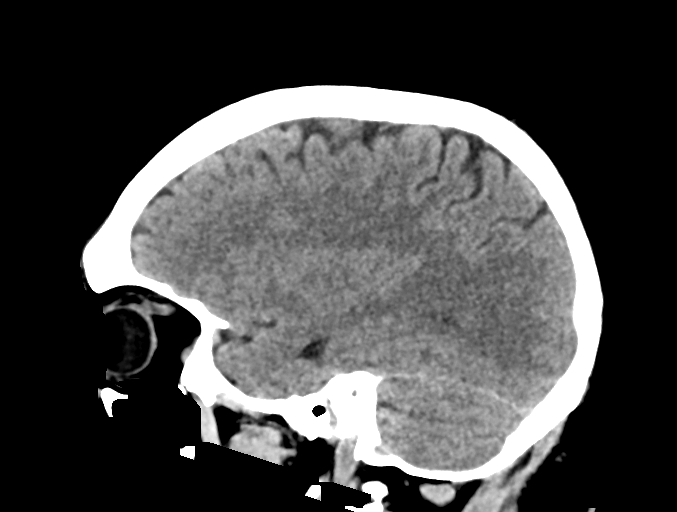
[im 34/67  brain]
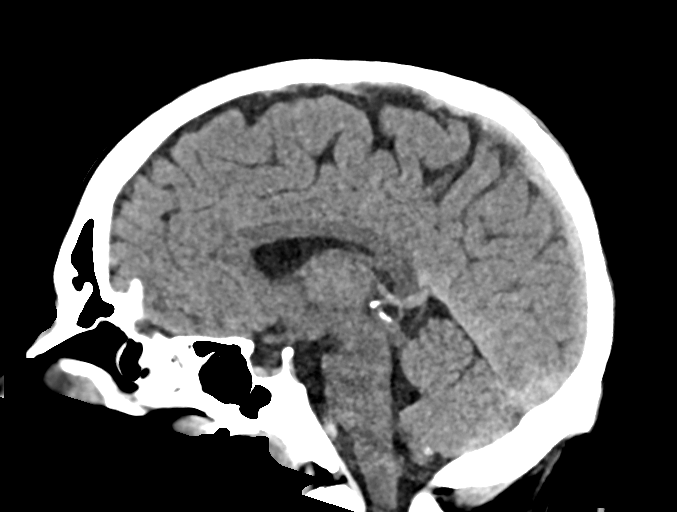
[im 45/67  brain]
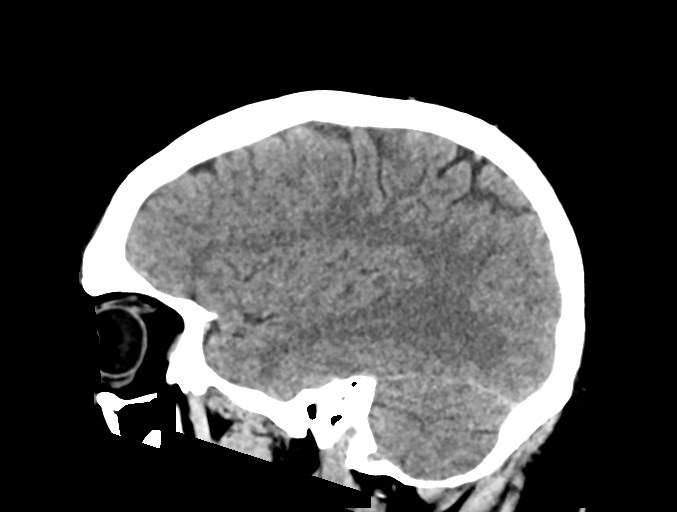

[17 of 47 positions shown; findings below may reference images not displayed]

FINDINGS: Brain: No acute territorial infarction, hemorrhage or intracranial
mass. The ventricles are nonenlarged

Vascular: No hyperdense vessel or unexpected calcification.

Skull: Normal. Negative for fracture or focal lesion.

Sinuses/Orbits: Mucosal thickening in the ethmoid sinuses

Other: None
IMPRESSION: Negative non contrasted CT appearance of the brain
